# Patient Record
Sex: Male | Born: 1965 | Race: White | Hispanic: No | Marital: Married | Smoking: Former smoker
Health system: Southern US, Community
[De-identification: ages and names within clinical notes are randomized; demographics above are authoritative.]

## PROBLEM LIST (undated history)

## (undated) DIAGNOSIS — K4090 Unilateral inguinal hernia, without obstruction or gangrene, not specified as recurrent: Secondary | ICD-10-CM

## (undated) DIAGNOSIS — N529 Male erectile dysfunction, unspecified: Secondary | ICD-10-CM

## (undated) DIAGNOSIS — K089 Disorder of teeth and supporting structures, unspecified: Secondary | ICD-10-CM

## (undated) DIAGNOSIS — J309 Allergic rhinitis, unspecified: Secondary | ICD-10-CM

## (undated) DIAGNOSIS — R7301 Impaired fasting glucose: Secondary | ICD-10-CM

## (undated) HISTORY — DX: Impaired fasting glucose: R73.01

## (undated) HISTORY — DX: Disorder of teeth and supporting structures, unspecified: K08.9

## (undated) HISTORY — DX: Allergic rhinitis, unspecified: J30.9

## (undated) HISTORY — DX: Male erectile dysfunction, unspecified: N52.9

---

## 2004-11-21 ENCOUNTER — Emergency Department: Payer: Self-pay | Admitting: Internal Medicine

## 2007-05-19 ENCOUNTER — Emergency Department: Payer: Self-pay | Admitting: Emergency Medicine

## 2011-06-06 ENCOUNTER — Emergency Department: Payer: Self-pay | Admitting: Internal Medicine

## 2011-06-20 ENCOUNTER — Ambulatory Visit: Payer: Self-pay | Admitting: Orthopedic Surgery

## 2013-02-27 ENCOUNTER — Emergency Department: Payer: Self-pay | Admitting: Internal Medicine

## 2013-11-15 ENCOUNTER — Emergency Department: Payer: Self-pay | Admitting: Emergency Medicine

## 2015-05-13 ENCOUNTER — Emergency Department: Payer: BLUE CROSS/BLUE SHIELD

## 2015-05-13 ENCOUNTER — Encounter: Payer: Self-pay | Admitting: Emergency Medicine

## 2015-05-13 ENCOUNTER — Emergency Department
Admission: EM | Admit: 2015-05-13 | Discharge: 2015-05-13 | Disposition: A | Discharge status: Home / Self Care | Payer: BLUE CROSS/BLUE SHIELD | Attending: Emergency Medicine | Admitting: Emergency Medicine

## 2015-05-13 DIAGNOSIS — M549 Dorsalgia, unspecified: Secondary | ICD-10-CM | POA: Diagnosis not present

## 2015-05-13 DIAGNOSIS — R1030 Lower abdominal pain, unspecified: Secondary | ICD-10-CM | POA: Diagnosis not present

## 2015-05-13 DIAGNOSIS — R55 Syncope and collapse: Secondary | ICD-10-CM

## 2015-05-13 DIAGNOSIS — R197 Diarrhea, unspecified: Secondary | ICD-10-CM | POA: Insufficient documentation

## 2015-05-13 HISTORY — DX: Unilateral inguinal hernia, without obstruction or gangrene, not specified as recurrent: K40.90

## 2015-05-13 LAB — CBC
HEMATOCRIT: 37.8 % — AB (ref 40.0–52.0)
HEMOGLOBIN: 13.4 g/dL (ref 13.0–18.0)
MCH: 33.1 pg (ref 26.0–34.0)
MCHC: 35.4 g/dL (ref 32.0–36.0)
MCV: 93.4 fL (ref 80.0–100.0)
Platelets: 141 10*3/uL — ABNORMAL LOW (ref 150–440)
RBC: 4.05 MIL/uL — AB (ref 4.40–5.90)
RDW: 12.9 % (ref 11.5–14.5)
WBC: 7.2 10*3/uL (ref 3.8–10.6)

## 2015-05-13 LAB — COMPREHENSIVE METABOLIC PANEL
ALBUMIN: 4 g/dL (ref 3.5–5.0)
ALK PHOS: 49 U/L (ref 38–126)
ALT: 23 U/L (ref 17–63)
AST: 21 U/L (ref 15–41)
Anion gap: 6 (ref 5–15)
BUN: 19 mg/dL (ref 6–20)
CALCIUM: 8.9 mg/dL (ref 8.9–10.3)
CHLORIDE: 105 mmol/L (ref 101–111)
CO2: 28 mmol/L (ref 22–32)
CREATININE: 1.18 mg/dL (ref 0.61–1.24)
GFR calc non Af Amer: >60 mL/min (ref >60)
Glucose, Bld: 146 mg/dL — ABNORMAL HIGH (ref 65–99)
Potassium: 3.4 mmol/L — ABNORMAL LOW (ref 3.5–5.1)
SODIUM: 139 mmol/L (ref 135–145)
TOTAL PROTEIN: 6.6 g/dL (ref 6.5–8.1)
Total Bilirubin: 0.5 mg/dL (ref 0.3–1.2)

## 2015-05-13 LAB — URINALYSIS COMPLETE WITH MICROSCOPIC (ARMC ONLY)
BACTERIA UA: NONE SEEN
Bilirubin Urine: NEGATIVE
Glucose, UA: NEGATIVE mg/dL
HGB URINE DIPSTICK: NEGATIVE
KETONES UR: NEGATIVE mg/dL
Leukocytes, UA: NEGATIVE
NITRITE: NEGATIVE
PH: 5 (ref 5.0–8.0)
PROTEIN: NEGATIVE mg/dL
SPECIFIC GRAVITY, URINE: 1.025 (ref 1.005–1.030)
Squamous Epithelial / LPF: NONE SEEN

## 2015-05-13 LAB — LIPASE, BLOOD: LIPASE: 23 U/L (ref 11–51)

## 2015-05-13 LAB — TROPONIN I

## 2015-05-13 MED ORDER — DIATRIZOATE MEGLUMINE & SODIUM 66-10 % PO SOLN
15.0000 mL | Freq: Once | ORAL | Status: AC
  Administered 2015-05-13: 15 mL via ORAL

## 2015-05-13 MED ORDER — SENNA 8.6 MG PO TABS: 2.0000 | ORAL_TABLET | Freq: Two times a day (BID) | ORAL | Status: DC

## 2015-05-13 MED ORDER — DOCUSATE SODIUM 100 MG PO CAPS: 200.0000 mg | ORAL_CAPSULE | Freq: Two times a day (BID) | ORAL | Status: DC

## 2015-05-13 MED ORDER — IOPAMIDOL (ISOVUE-300) INJECTION 61%
100.0000 mL | Freq: Once | INTRAVENOUS | Status: AC | PRN
  Administered 2015-05-13: 100 mL via INTRAVENOUS

## 2015-05-13 NOTE — ED Provider Notes (Signed)
[facility] Emergency Department Provider Note  ____________________________________________  Time seen: Approximately 636 AM  I have reviewed the triage vital signs and the nursing notes.   HISTORY  Chief Complaint Abdominal Pain    HPI Tyler Conner Sr. is a 50 y.o. male who comes into the hospital today with abdominal pain. The patient's significant other reports that one year ago he had a hernia and was told to take it easy and that it would get better. The patient's significant other reports that he's had a cold since last week and has been doing a lot of lifting and straining. She is concerned that the hernia is coming back. The patient has had some pain across his lower abdomen for the last couple of days and he has had been having some difficulty having a bowel movement. This morning the patient went to attempt to use restroom and according to his wife he looked gray and like he was 1 past. He was sweaty and pale. He has not had any vomiting but the patient's wife became concerned. He reports that she was looking on the Internet and they became concerned about his previous hernia. He reports that the pain is improved at this point but he did have an episode of diarrhea this morning. He keeps saying that he feels as though he has to have a bowel movement but then he's had a hard time or has diarrhea. He's had no fevers and no sick contacts. He has not taken any medications for his pain. Again the pain started multiple days ago but the pain seemed to get worse today and he seemed to have worse symptoms with the presyncopal event at home. The patient's wife brought him into the hospital for evaluation.   Past Medical History  Diagnosis Date  . Inguinal hernia     There are no active problems to display for this patient.   History reviewed. No pertinent past surgical history.  No current outpatient prescriptions on file.  Allergies Review of patient's  allergies indicates no known allergies.  History reviewed. No pertinent family history.  Social History Social History  Substance Use Topics  . Smoking status: Never Smoker   . Smokeless tobacco: None  . Alcohol Use: No    Review of Systems Constitutional: No fever/chills Eyes: No visual changes. ENT: No sore throat. Cardiovascular: Denies chest pain. Respiratory: Denies shortness of breath. Gastrointestinal:  abdominal pain And diarrhea.  No constipation. Genitourinary: Negative for dysuria. Musculoskeletal: Right sided back pain. Skin: Negative for rash. Neurological: Negative for headaches, focal weakness or numbness.  10-point ROS otherwise negative.  ____________________________________________   PHYSICAL EXAM:  VITAL SIGNS: ED Triage Vitals  Enc Vitals Group     BP 05/13/15 0551 122/85 mmHg     Pulse Rate 05/13/15 0551 62     Resp 05/13/15 0551 18     Temp 05/13/15 0551 97.7 F (36.5 C)     Temp Source 05/13/15 0551 Oral     SpO2 05/13/15 0551 97 %     Weight 05/13/15 0551 150 lb (68.04 kg)     Height 05/13/15 0551  (1.854 m)     Head Cir --      Peak Flow --      Pain Score 05/13/15 0552 2     Pain Loc --      Pain Edu? --      Excl. in GC? --     Constitutional: Alert and oriented. Well  appearing and in mild distress. Eyes: Conjunctivae are normal. PERRL. EOMI. Head: Atraumatic. Nose: No congestion/rhinnorhea. Mouth/Throat: Mucous membranes are moist.  Oropharynx non-erythematous. Cardiovascular: Normal rate, regular rhythm. Grossly normal heart sounds.  Good peripheral circulation. Respiratory: Normal respiratory effort.  No retractions. Lungs CTAB. Gastrointestinal: Soft and nontender. No distention. Positive bowel sounds Musculoskeletal: No lower extremity tenderness nor edema.   Neurologic:  Normal speech and language.  Skin:  Skin is warm, dry and intact.  Psychiatric: Mood and affect are normal.    ____________________________________________   LABS (all labs ordered are listed, but only abnormal results are displayed)  Labs Reviewed  COMPREHENSIVE METABOLIC PANEL - Abnormal; Notable for the following:    Potassium 3.4 (*)    Glucose, Bld 146 (*)    All other components within normal limits  CBC - Abnormal; Notable for the following:    RBC 4.05 (*)    HCT 37.8 (*)    Platelets 141 (*)    All other components within normal limits  URINALYSIS COMPLETEWITH MICROSCOPIC ([facility] ONLY) - Abnormal; Notable for the following:    Color, Urine YELLOW (*)    APPearance CLEAR (*)    All other components within normal limits  LIPASE, BLOOD  TROPONIN I   ____________________________________________  EKG  None ____________________________________________  RADIOLOGY  CT abd and pelvis pending ____________________________________________   PROCEDURES  Procedure(s) performed: None  Critical Care performed: No  ____________________________________________   INITIAL IMPRESSION / ASSESSMENT AND PLAN / ED COURSE  Pertinent labs & imaging results that were available during my care of the patient were reviewed by me and considered in my medical decision making (see chart for details).  This is a 50 year old male who comes into the hospital today with some abdominal pain and back pain. The patient also had some diarrhea but did turn gray and diaphoretic with a presyncopal event tonight. I will give the patient liter of normal saline but we will send him for a CT scan. The patient's blood work at this time is unremarkable but we will send him for a CT scan looking to evaluate his hernia or any other possible cause of his symptoms. The patient's care was signed out to Dr. Scotty CourtStafford who will follow-up the results of the patient's CT scan. ____________________________________________   FINAL CLINICAL IMPRESSION(S) / ED DIAGNOSES  Final diagnoses:  Lower abdominal pain       Rebecka ApleyAllison P Webster, MD 05/13/15 [phone]

## 2015-05-13 NOTE — ED Notes (Signed)
Pt c/o intermittent abd pain across the center of his abd for a couple of days; last normal bowel movement was a few days ago; denies N/V; denies urinary s/s;

## 2015-05-13 NOTE — Discharge Instructions (Signed)
Abdominal Pain, Adult °Many things can cause abdominal pain. Usually, abdominal pain is not caused by a disease and will improve without treatment. It can often be observed and treated at home. Your health care provider will do a physical exam and possibly order blood tests and X-rays to help determine the seriousness of your pain. However, in many cases, more time must pass before a clear cause of the pain can be found. Before that point, your health care provider may not know if you need more testing or further treatment. °HOME CARE INSTRUCTIONS °Monitor your abdominal pain for any changes. The following actions may help to alleviate any discomfort you are experiencing: °· Only take over-the-counter or prescription medicines as directed by your health care provider. °· Do not take laxatives unless directed to do so by your health care provider. °· Try a clear liquid diet (broth, tea, or water) as directed by your health care provider. Slowly move to a bland diet as tolerated. °SEEK MEDICAL CARE IF: °· You have unexplained abdominal pain. °· You have abdominal pain associated with nausea or diarrhea. °· You have pain when you urinate or have a bowel movement. °· You experience abdominal pain that wakes you in the night. °· You have abdominal pain that is worsened or improved by eating food. °· You have abdominal pain that is worsened with eating fatty foods. °· You have a fever. °SEEK IMMEDIATE MEDICAL CARE IF: °· Your pain does not go away within 2 hours. °· You keep throwing up (vomiting). °· Your pain is felt only in portions of the abdomen, such as the right side or the left lower portion of the abdomen. °· You pass bloody or black tarry stools. °MAKE SURE YOU: °· Understand these instructions. °· Will watch your condition. °· Will get help right away if you are not doing well or get worse. °  °This information is not intended to replace advice given to you by your health care provider. Make sure you discuss  any questions you have with your health care provider. °  °Document Released: 11/12/2004 Document Revised: 10/24/2014 Document Reviewed: 10/12/2012 °Elsevier Interactive Patient Education ©2016 [employer] ° °Near-Syncope °Near-syncope (commonly known as near fainting) is sudden weakness, dizziness, or feeling like you might pass out. During an episode of near-syncope, you may also develop pale skin, have tunnel vision, or feel sick to your stomach (nauseous). Near-syncope may occur when getting up after sitting or while standing for a long time. It is caused by a sudden decrease in blood flow to the brain. This decrease can result from various causes or triggers, most of which are not serious. However, because near-syncope can sometimes be a sign of something serious, a medical evaluation is required. The specific cause is often not determined. °HOME CARE INSTRUCTIONS  °Monitor your condition for any changes. The following actions may help to alleviate any discomfort you are experiencing: °· Have someone stay with you until you feel stable. °· Lie down right away and prop your feet up if you start feeling like you might faint. Breathe deeply and steadily. Wait until all the symptoms have passed. Most of these episodes last only a few minutes. You may feel tired for several hours.   °· Drink enough fluids to keep your urine clear or pale yellow.   °· If you are taking blood pressure or heart medicine, get up slowly when seated or lying down. Take several minutes to sit and then stand. This can reduce dizziness. °· Follow   up with your health care provider as directed.  °SEEK IMMEDIATE MEDICAL CARE IF:  °· You have a severe headache.   °· You have unusual pain in the chest, abdomen, or back.   °· You are bleeding from the mouth or rectum, or you have black or tarry stool.   °· You have an irregular or very fast heartbeat.   °· You have repeated fainting or have seizure-like jerking during an episode.   °· You faint  when sitting or lying down.   °· You have confusion.   °· You have difficulty walking.   °· You have severe weakness.   °· You have vision problems.   °MAKE SURE YOU:  °· Understand these instructions. °· Will watch your condition. °· Will get help right away if you are not doing well or get worse. °  °This information is not intended to replace advice given to you by your health care provider. Make sure you discuss any questions you have with your health care provider. °  °Document Released: 02/02/2005 Document Revised: 02/07/2013 Document Reviewed: 07/08/2012 °Elsevier Interactive Patient Education ©2016 [employer] Inc. ° °

## 2015-05-13 NOTE — ED Provider Notes (Signed)
Patient calm and comfortable, vital signs normal. CT negative. His charge home follow up with primary care. Senna and Colace to avoid further straining that likely caused a vagal episode today from inadvertent Valsalva maneuver from increased intra-abdominal pressure from straining. No evidence of hernias.   Sharman CheekPhillip Verenice Westrich, MD 05/13/15 1007

## 2015-05-13 NOTE — ED Notes (Signed)
Blood draw attempt from left AC unsuccessful; pt tolerated well; prefaced procedure by saying he doesn't do well if he sees a needle; pt laying down in recliner in triage room

## 2015-05-14 ENCOUNTER — Emergency Department (HOSPITAL_COMMUNITY)
Admission: EM | Admit: 2015-05-14 | Discharge: 2015-05-14 | Disposition: A | Discharge status: Home / Self Care | Payer: BLUE CROSS/BLUE SHIELD | Attending: Emergency Medicine | Admitting: Emergency Medicine

## 2015-05-14 ENCOUNTER — Encounter (HOSPITAL_COMMUNITY): Payer: Self-pay | Admitting: Emergency Medicine

## 2015-05-14 DIAGNOSIS — Z79899 Other long term (current) drug therapy: Secondary | ICD-10-CM | POA: Diagnosis not present

## 2015-05-14 DIAGNOSIS — R1084 Generalized abdominal pain: Secondary | ICD-10-CM | POA: Diagnosis present

## 2015-05-14 DIAGNOSIS — R197 Diarrhea, unspecified: Secondary | ICD-10-CM | POA: Insufficient documentation

## 2015-05-14 DIAGNOSIS — R109 Unspecified abdominal pain: Secondary | ICD-10-CM

## 2015-05-14 LAB — COMPREHENSIVE METABOLIC PANEL
ALBUMIN: 4.2 g/dL (ref 3.5–5.0)
ALK PHOS: 53 U/L (ref 38–126)
ALT: 24 U/L (ref 17–63)
ANION GAP: 10 (ref 5–15)
AST: 25 U/L (ref 15–41)
BILIRUBIN TOTAL: 0.7 mg/dL (ref 0.3–1.2)
BUN: 17 mg/dL (ref 6–20)
CALCIUM: 9.1 mg/dL (ref 8.9–10.3)
CO2: 28 mmol/L (ref 22–32)
Chloride: 103 mmol/L (ref 101–111)
Creatinine, Ser: 1.15 mg/dL (ref 0.61–1.24)
GFR calc Af Amer: >60 mL/min (ref >60)
GFR calc non Af Amer: >60 mL/min (ref >60)
GLUCOSE: 185 mg/dL — AB (ref 65–99)
Potassium: 4 mmol/L (ref 3.5–5.1)
Sodium: 141 mmol/L (ref 135–145)
TOTAL PROTEIN: 7.2 g/dL (ref 6.5–8.1)

## 2015-05-14 LAB — URINE MICROSCOPIC-ADD ON
Bacteria, UA: NONE SEEN
SQUAMOUS EPITHELIAL / LPF: NONE SEEN
WBC UA: NONE SEEN WBC/hpf (ref 0–5)

## 2015-05-14 LAB — CBC
HCT: 41.3 % (ref 39.0–52.0)
HEMOGLOBIN: 14.3 g/dL (ref 13.0–17.0)
MCH: 32.3 pg (ref 26.0–34.0)
MCHC: 34.6 g/dL (ref 30.0–36.0)
MCV: 93.2 fL (ref 78.0–100.0)
Platelets: 162 10*3/uL (ref 150–400)
RBC: 4.43 MIL/uL (ref 4.22–5.81)
RDW: 12.3 % (ref 11.5–15.5)
WBC: 4.3 10*3/uL (ref 4.0–10.5)

## 2015-05-14 LAB — URINALYSIS, ROUTINE W REFLEX MICROSCOPIC
BILIRUBIN URINE: NEGATIVE
Glucose, UA: NEGATIVE mg/dL
KETONES UR: NEGATIVE mg/dL
Leukocytes, UA: NEGATIVE
NITRITE: NEGATIVE
PROTEIN: NEGATIVE mg/dL
Specific Gravity, Urine: 1.02 (ref 1.005–1.030)
pH: 5 (ref 5.0–8.0)

## 2015-05-14 LAB — LIPASE, BLOOD: Lipase: 25 U/L (ref 11–51)

## 2015-05-14 MED ORDER — DICYCLOMINE HCL 10 MG PO CAPS: 10.0000 mg | ORAL_CAPSULE | Freq: Two times a day (BID) | ORAL | Status: DC

## 2015-05-14 NOTE — ED Notes (Signed)
[facility] yesterday and treated with stool softner and laxative.  CT of abdomen done yesterday.

## 2015-05-14 NOTE — Discharge Instructions (Signed)

## 2015-05-14 NOTE — ED Provider Notes (Signed)
CSN: [id number]     Arrival date & time 05/14/15  5638 History  By signing my name below, I, Tyler Conner, attest that this documentation has been prepared under the direction and in the presence of [name], MD. Electronically Signed: Ronney Conner, ED Scribe. 05/14/2015. 11:43 AM.    Chief Complaint  Patient presents with  . Abdominal Cramping   Patient is a 50 y.o. male presenting with cramps. The history is provided by the patient. No language interpreter was used.  Abdominal Cramping This is a new problem. The current episode started yesterday. The problem occurs constantly. The problem has been gradually worsening. Associated symptoms include abdominal pain. Pertinent negatives include no chest pain and no shortness of breath. Nothing aggravates the symptoms. Nothing relieves the symptoms. Treatments tried: laxative, magnesium. The treatment provided no relief.   HPI Comments: Tyler Conner. is a 50 y.o. male who presents to the Emergency Department complaining of severe, cramping, generalized abdominal pain that began 3 days ago upon waking up. He complains of an associated sense of fecal urgency (although he does not always have an accompanying BM) and diarrhea that began yesterday. His wife states at one point over the past few days, patient appeared pale and near-syncopal, although that episode has since resolved. Patient states he was seen at [facility] ED yesterday and had blood lab tests done that were unremarkable. Patient states he had taken a laxative and magnesium with no relief. Patient had a CT scan done yesterday which shows that is he is status post left inguinal hernia repair, although patient himself denies a history of any hernia repairs. He denies a history of chronic medical conditions, abdominal surgeries, recent international travel, or sick contact. He denies vomiting, fever, dysuria, chest pain, SOB, or hematochezia.   Past Medical History  Diagnosis Date  .  Inguinal hernia    History reviewed. No pertinent past surgical history. History reviewed. No pertinent family history. Social History  Substance Use Topics  . Smoking status: Never Smoker   . Smokeless tobacco: None  . Alcohol Use: No    Review of Systems  Constitutional: Negative for fever.  Respiratory: Negative for shortness of breath.   Cardiovascular: Negative for chest pain.  Gastrointestinal: Positive for abdominal pain and diarrhea. Negative for vomiting and blood in stool.  Genitourinary: Negative for dysuria.  Skin: Positive for pallor (Positive for resolved episode of pallor, near-syncopal.).  All other systems reviewed and are negative.   Allergies  Review of patient's allergies indicates no known allergies.  Home Medications   Prior to Admission medications   Medication Sig Start Date End Date Taking? Authorizing Provider  docusate sodium (COLACE) 100 MG capsule Take 2 capsules (200 mg total) by mouth 2 (two) times daily. 05/13/15  Yes Sharman Cheek, MD  senna (SENOKOT) 8.6 MG TABS tablet Take 2 tablets (17.2 mg total) by mouth 2 (two) times daily. 05/13/15  Yes Sharman Cheek, MD   BP 118/79 mmHg  Pulse 78  Temp(Src) 98.3 F (36.8 C) (Oral)  Resp 18  Ht  (1.854 m)  Wt 160 lb (72.576 kg)  BMI 21.11 kg/m2  SpO2 98% Physical Exam  Nursing note and vitals reviewed. CONSTITUTIONAL: Well developed/well nourished HEAD: Normocephalic/atraumatic EYES: EOMI/PERRL ENMT: Mucous membranes moist NECK: supple no meningeal signs SPINE/BACK:entire spine nontender CV: S1/S2 noted, no murmurs/rubs/gallops noted LUNGS: Lungs are clear to auscultation bilaterally, no apparent distress ABDOMEN: soft, nontender, no rebound or guarding, bowel sounds noted throughout abdomen GU:no cva  tenderness, no inguinal hernia noted, no scar NEURO: Pt is awake/alert/appropriate, moves all extremitiesx4.  No facial droop.   EXTREMITIES: pulses normal/equal, full ROM SKIN: warm,  color normal PSYCH: no abnormalities of mood noted, alert and oriented to situation   ED Course  Procedures   DIAGNOSTIC STUDIES: Oxygen Saturation is 98% on RA, normal by my interpretation.    COORDINATION OF CARE: 11:40 AM - Lab results and CT results reviewed with patient. Discussed treatment plan with pt at bedside which includes Bentyl for 1 week. Advised pt to follow-up with GI specialist if his symptoms do not improve with Bentyl. Do not believe imaging is warranted at this time. Recommended bland diet. Pt verbalized understanding and agreed to plan.   i spoke to dr mark boles concerning recent CT imaging No evidence of inguinal hernia repair on CT imaging on second look Referred to GI Will start bentyl No focal tenderness on today's exam   Labs Review Labs Reviewed  COMPREHENSIVE METABOLIC PANEL - Abnormal; Notable for the following:    Glucose, Bld 185 (*)    All other components within normal limits  URINALYSIS, ROUTINE W REFLEX MICROSCOPIC (NOT AT [facility]) - Abnormal; Notable for the following:    Hgb urine dipstick TRACE (*)    All other components within normal limits  LIPASE, BLOOD  CBC  URINE MICROSCOPIC-ADD ON    Imaging Review Ct Abdomen Pelvis W Contrast  05/13/2015  CLINICAL DATA:  Intermittent abdominal pain for 3 days. Constipation. EXAM: CT ABDOMEN AND PELVIS WITH CONTRAST TECHNIQUE: Multidetector CT imaging of the abdomen and pelvis was performed using the standard protocol following bolus administration of intravenous contrast. Oral contrast was also administered. CONTRAST:  ISOVUE-300 IOPAMIDOL (ISOVUE-300) INJECTION 61% COMPARISON:  None. FINDINGS: Lower chest: There is mild atelectasis in the posterior left base region. Lung bases otherwise are clear. Hepatobiliary: No focal liver lesions are identified. Gallbladder wall is not appreciably thickened. There is no biliary duct dilatation. Pancreas: There is no demonstrable pancreatic mass or inflammatory  focus. Spleen: No splenic lesions are evident. There is a small splenule medial to the spleen. Adrenals/Urinary Tract: Adrenals appear normal bilaterally. Kidneys appear normal bilaterally. There is no renal or ureteral calculus on either side. Urinary bladder is midline with wall thickness within normal limits. Stomach/Bowel: There are multiple sigmoid diverticula without demonstrable diverticulitis. There is no bowel wall or mesenteric thickening. No bowel obstruction. No free air or portal venous air. Vascular/Lymphatic: There is mild atherosclerotic change in the aorta. There is no demonstrable abdominal aortic aneurysm. The major mesenteric vessels appear patent. There is no adenopathy in the abdomen or pelvis. Reproductive: Prostate and seminal vesicles appear normal in size and contour. There are small scrotal hydroceles bilaterally. There is no pelvic mass or pelvic fluid collection. Other: Patient has had a left inguinal hernia repair. No hernia currently evident. Appendix appears normal. There is no ascites or abscess in the abdomen or pelvis. Musculoskeletal: There are no blastic or lytic bone lesions. There is degenerative change in the lower lumbar spine. There is no intramuscular or abdominal wall lesion. IMPRESSION: Multiple sigmoid diverticula without diverticulitis. No bowel obstruction. No abscess. Appendix region appears unremarkable. Small scrotal hydroceles bilaterally. Status post left inguinal hernia repair. No hernia currently evident. No renal or ureteral calculi.  No hydronephrosis. Electronically Signed   By: Bretta Bang III M.D.   On: 05/13/2015 09:50   I have personally reviewed and evaluated these lab results as part of my medical decision-making.  MDM   Final diagnoses:  Abdominal cramping    Nursing notes including past medical history and social history reviewed and considered in documentation Labs/vital reviewed myself and considered during evaluation .dwpre   I  personally performed the services described in this documentation, which was scribed in my presence. The recorded information has been reviewed and is accurate.        Zadie Rhineonald Meg Niemeier, MD 05/14/15 1250

## 2015-07-03 ENCOUNTER — Encounter: Payer: BLUE CROSS/BLUE SHIELD | Attending: Family Medicine | Admitting: Dietician

## 2015-07-03 ENCOUNTER — Encounter: Payer: Self-pay | Admitting: Dietician

## 2015-07-03 VITALS — Ht 73.0 in | Wt 156.0 lb

## 2015-07-03 DIAGNOSIS — R7301 Impaired fasting glucose: Secondary | ICD-10-CM

## 2015-07-03 NOTE — Patient Instructions (Signed)
Increase water intake.  Decrease sweet tea. Establish consistent meal plan of 3 meals and 3 snacks. 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 Balance meals with protein, 3-6 servings of carbohydrates and non-starchy vegetables. Include healthy fats such as fat in peanut butter, nuts, oils and margarines that don't contain trans fat (Promise, "I Can't Believe it's not butter" or Smart Balance".

## 2015-07-03 NOTE — Progress Notes (Signed)
Medical Nutrition Therapy: Visit start time: 8:15  end time:9:30am Assessment:  Diagnosis: impaired glucose tolerance Past medical history: inguinal hernia Psychosocial issues/ stress concerns: none identified Preferred learning method:  . Hands-on Current weight: 156 lbs  Height: 73 in Medications, supplements: see list Progress and evaluation:  Patient accompanied by his wife in for initial medical nutrition therapy visit. He reports that he has tried recently to make some diet changes. 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 Most of his calories during the day were contributed by sweet tea and he estimates he drinks as much as 1/2 to 1 gallon daily. Presently, he has been trying to eat breakfast and lunch. Most of his dinner meals are at home and his wife has been baking more meats verses frying. He has decreased evening snacking. He states "Before, I was eating a sleeve of oreos with milk and would feel lousy in the mornings". He also reports symptoms of low blood sugar when he hasn't eaten for hours. He doesn't check his blood glucose at home. His mother has diabetes and has a glucose meter. He reports that his weight typically stays in 155-160 range. His diet is low in fruits/vegetables, fiber and whole grain sources. Physical activity: States that his job involves a lot of walking  Dietary Intake:  Usual eating pattern includes 2-3 meals and 1-2 snacks per day. Dining out frequency: 4 meals per week.  Breakfast: 6-8:00am- banana, lite English muffin with sausage/cheese, sweet tea Lunch: Big Mac, tea or double cheeseburger, tea or 12" sub sandwich, chips, tea  Supper: 6:00pm- baked chicken or pork, potatoes or rice, corn or green beans or broccoli or a pasta meal Snack: lately has not been eating after dinner Beverages: Sweet tea (1/2 to 1 gallon/day); rarely drinks water; no sodas  Nutrition Care Education: Impaired glucose  tolerance: Commended patient on diet changes he has made thus far.Instructed on general dietary guidelines for pre-diabetes. Discussed the importance of establishing a consistent meal pattern rather than taking in a majority of his food from 6:00 to bedtime.Also instructed on carbohydrate counting including the need to spread food over 3 meals and 3 snacks. Discussed how drinking large amounts of sweet tea is over working his pancreas. Discussed how snacks will be necessary to provide adequate calories especially when decreasing calories from tea. Gave and reviewed menu examples. 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  Nutritional Diagnosis:  NI-5.8.2 Excessive carbohydrate intake As related to excessive sweet tea.  As evidenced by diet history..  Intervention:  Increase water intake.  Decrease sweet tea. Establish consistent meal plan of 3 meals and 3 snacks. 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 Balance meals with protein, 3-6 servings of carbohydrates and non-starchy vegetables. Include healthy fats such as fat in peanut butter, nuts, oils and margarines that don't contain trans fat (Promise, "I Can't Believe it's not butter" or Smart Balance".  Education Materials given:  . Food lists/ Planning A Balanced Meal . Sample meal pattern/ menus . Snacking handout . Goals/ instructions  Learner/ who was taught:  . Patient  . Spouse/ partner  Level of understanding: . Partial understanding; needs review/ practice Learning barriers: . None Willingness to learn/ readiness for change: . Change in Progress.Patient stated, "I have to change. I want to live".  Monitoring and Evaluation:  No follow-up scheduled. Encouraged patient to call if has further questions or if desires  additional help with his diet/nutrition.

## 2017-04-02 IMAGING — CT CT ABD-PELV W/ CM
1 of 3 series · 13 of 32 positions shown, 18 images · IV contrast (iopamidol)
Comparison: None.

CLINICAL DATA: Intermittent abdominal pain for 3 days.
Constipation.

EXAM:
CT ABDOMEN AND PELVIS WITH CONTRAST
TECHNIQUE: Multidetector CT imaging of the abdomen and pelvis was performed
using the standard protocol following bolus administration of
intravenous contrast. Oral contrast was also administered.
CONTRAST:  100mL [id number] IOPAMIDOL ([id number]) INJECTION 61%

[Series 2: routine abd pel with · axial · 0.72mm/px · z∈[-1082,-657]mm · 13 of 95 slices shown, 18 images]
[im 5/95  soft-tissue]
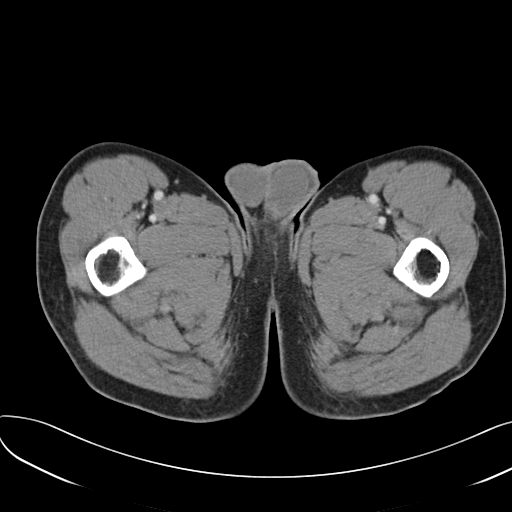
[im 5/95  bone]
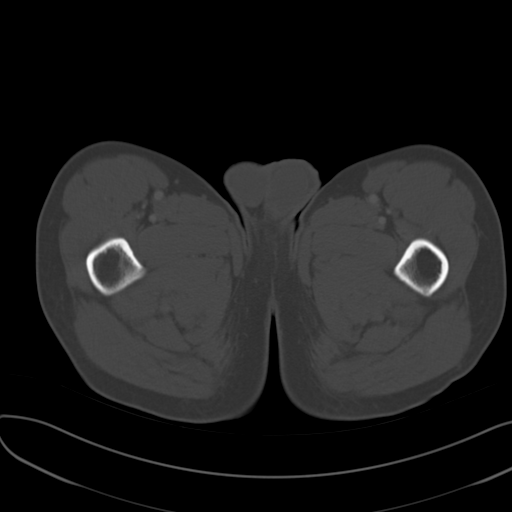
[im 15/95  soft-tissue]
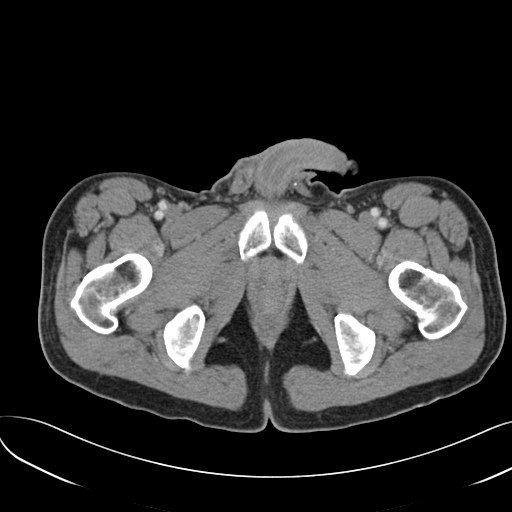
[im 19/95  soft-tissue]
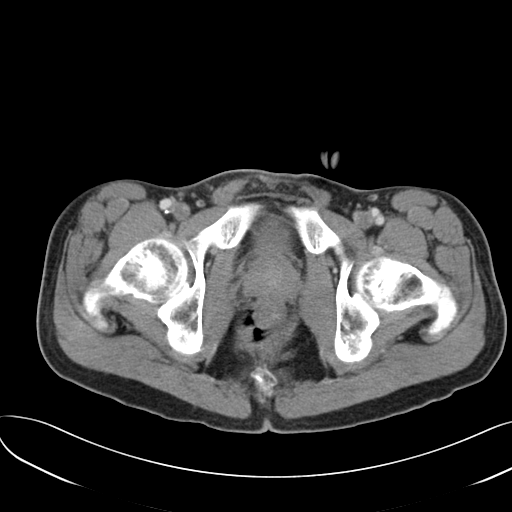
[im 29/95  soft-tissue]
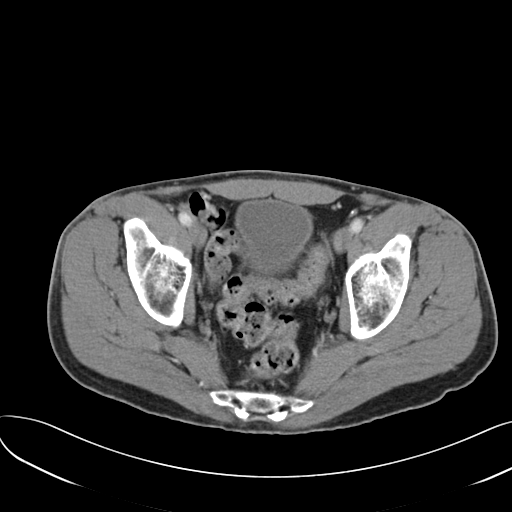
[im 38/95  soft-tissue]
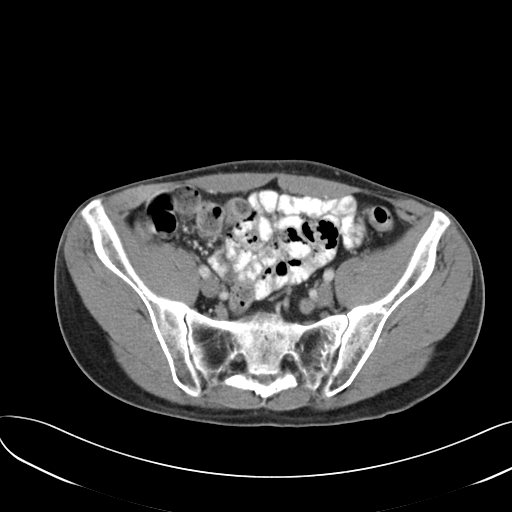
[im 43/95  soft-tissue]
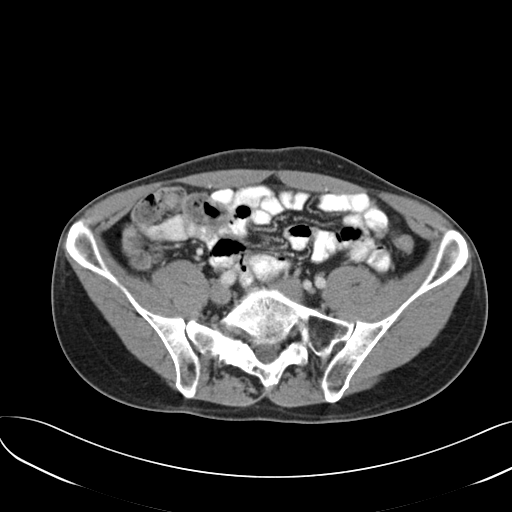
[im 52/95  soft-tissue]
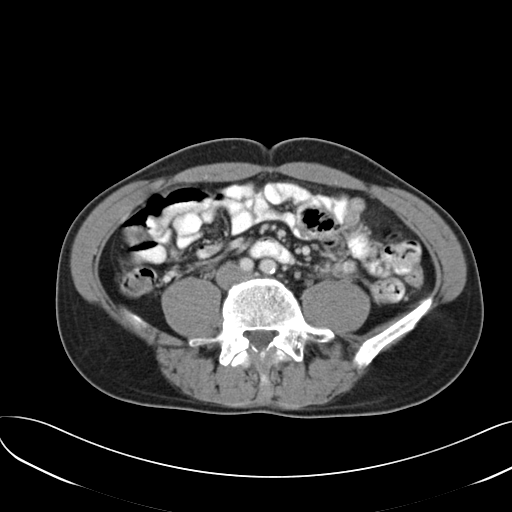
[im 57/95  soft-tissue]
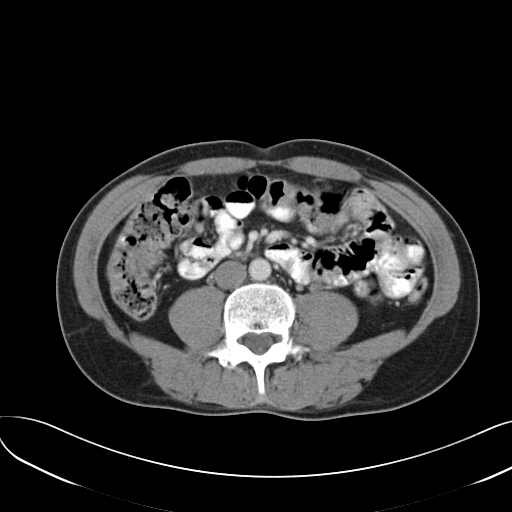
[im 66/95  soft-tissue]
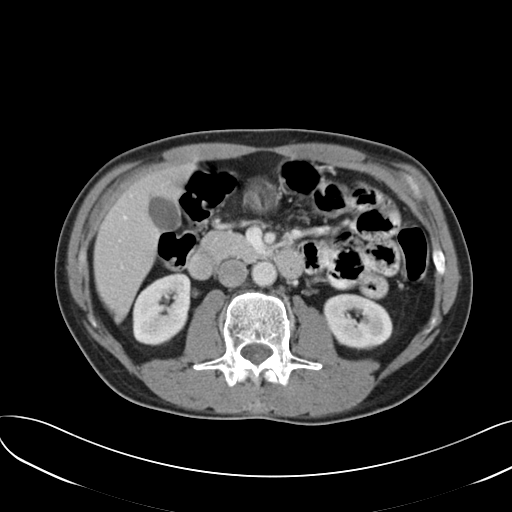
[im 66/95  bone]
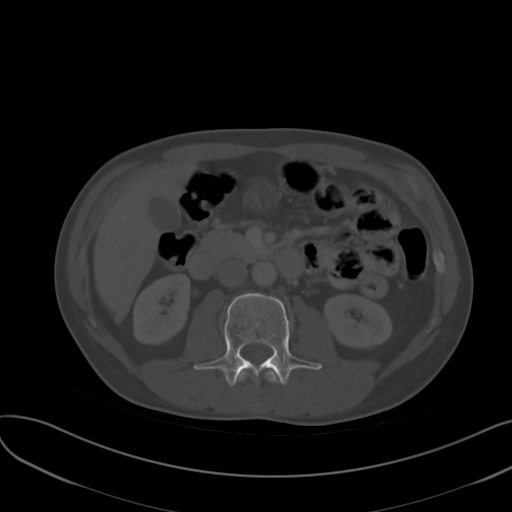
[im 76/95  soft-tissue]
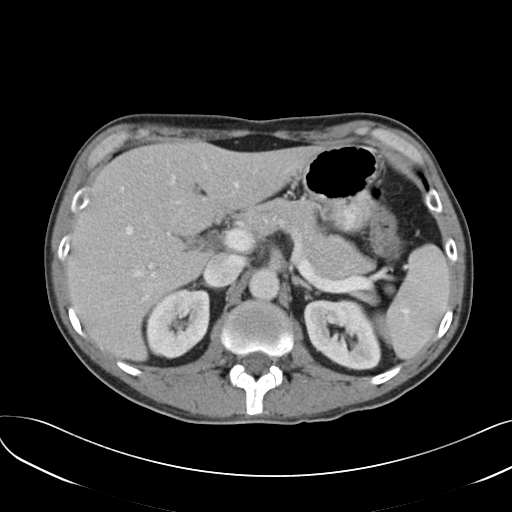
[im 76/95  lung]
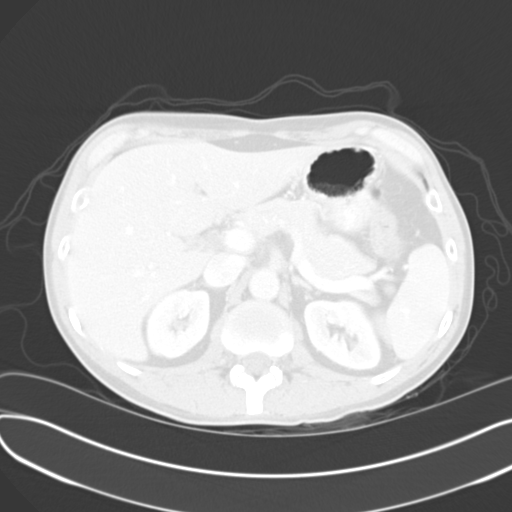
[im 80/95  soft-tissue]
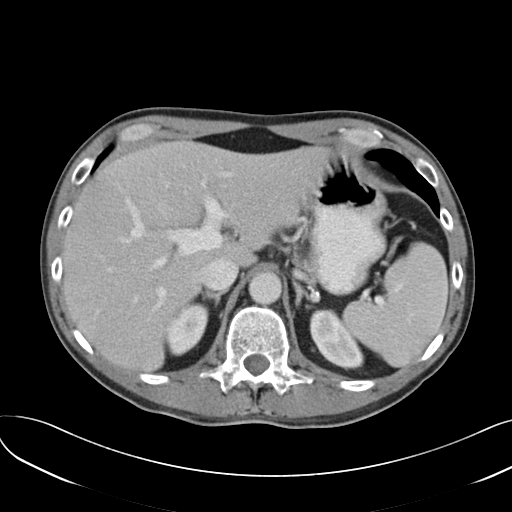
[im 80/95  lung]
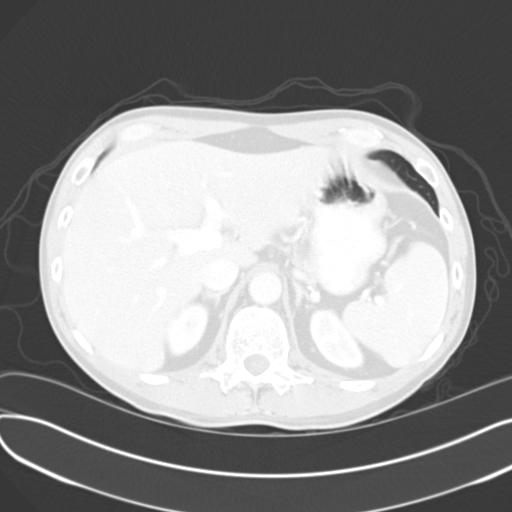
[im 85/95  lung]
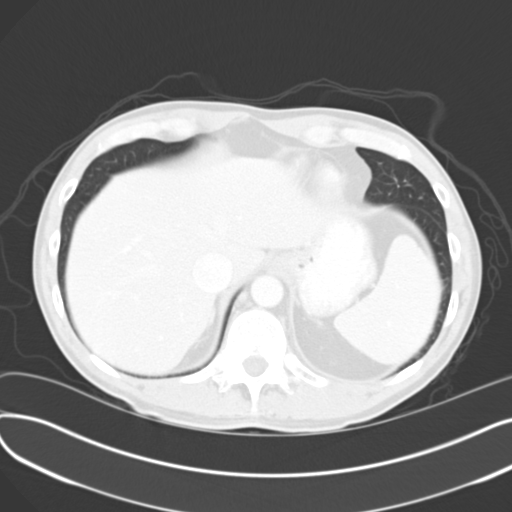
[im 90/95  soft-tissue]
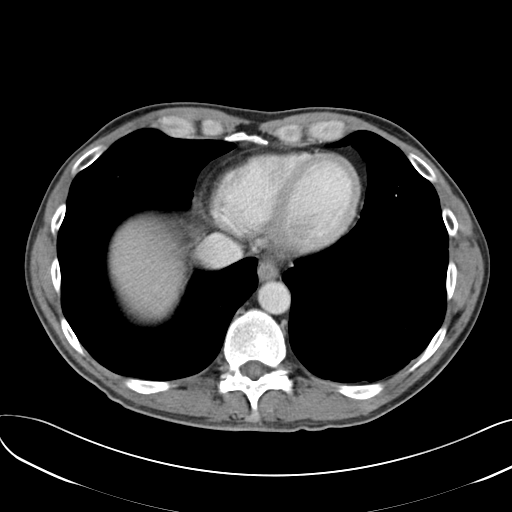
[im 90/95  lung]
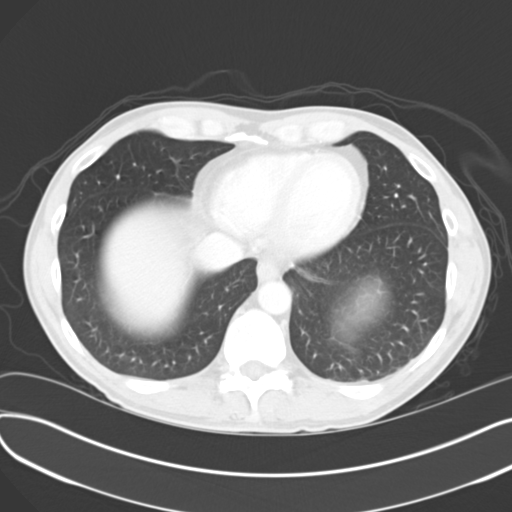

[13 of 32 positions shown; findings below may reference images not displayed]

FINDINGS: Lower chest: There is mild atelectasis in the posterior left base
region. Lung bases otherwise are clear.

Hepatobiliary: No focal liver lesions are identified. Gallbladder
wall is not appreciably thickened. There is no biliary duct
dilatation.

Pancreas: There is no demonstrable pancreatic mass or inflammatory
focus.

Spleen: No splenic lesions are evident. There is a small splenule
medial to the spleen.

Adrenals/Urinary Tract: Adrenals appear normal bilaterally. Kidneys
appear normal bilaterally. There is no renal or ureteral calculus on
either side. Urinary bladder is midline with wall thickness within
normal limits.

Stomach/Bowel: There are multiple sigmoid diverticula without
demonstrable diverticulitis. There is no bowel wall or mesenteric
thickening. No bowel obstruction. No free air or portal venous air.

Vascular/Lymphatic: There is mild atherosclerotic change in the
aorta. There is no demonstrable abdominal aortic aneurysm. The major
mesenteric vessels appear patent. There is no adenopathy in the
abdomen or pelvis.

Reproductive: Prostate and seminal vesicles appear normal in size
and contour. There are small scrotal hydroceles bilaterally. There
is no pelvic mass or pelvic fluid collection.

Other: Patient has had a left inguinal hernia repair. No hernia
currently evident. Appendix appears normal. There is no ascites or
abscess in the abdomen or pelvis.

Musculoskeletal: There are no blastic or lytic bone lesions. There
is degenerative change in the lower lumbar spine. There is no
intramuscular or abdominal wall lesion.
IMPRESSION: Multiple sigmoid diverticula without diverticulitis.

No bowel obstruction. No abscess. Appendix region appears
unremarkable.

Small scrotal hydroceles bilaterally. Status post left inguinal
hernia repair. No hernia currently evident.

No renal or ureteral calculi.  No hydronephrosis.

## 2018-03-15 ENCOUNTER — Emergency Department
Admission: EM | Admit: 2018-03-15 | Discharge: 2018-03-15 | Disposition: A | Discharge status: Home / Self Care | Payer: BLUE CROSS/BLUE SHIELD | Attending: Emergency Medicine | Admitting: Emergency Medicine

## 2018-03-15 ENCOUNTER — Encounter: Payer: Self-pay | Admitting: Medical Oncology

## 2018-03-15 DIAGNOSIS — Z87891 Personal history of nicotine dependence: Secondary | ICD-10-CM | POA: Diagnosis not present

## 2018-03-15 DIAGNOSIS — Y939 Activity, unspecified: Secondary | ICD-10-CM | POA: Diagnosis not present

## 2018-03-15 DIAGNOSIS — X04XXXA Exposure to ignition of highly flammable material, initial encounter: Secondary | ICD-10-CM | POA: Insufficient documentation

## 2018-03-15 DIAGNOSIS — Y999 Unspecified external cause status: Secondary | ICD-10-CM | POA: Diagnosis not present

## 2018-03-15 DIAGNOSIS — T22011A Burn of unspecified degree of right forearm, initial encounter: Secondary | ICD-10-CM | POA: Diagnosis present

## 2018-03-15 DIAGNOSIS — T22219A Burn of second degree of unspecified forearm, initial encounter: Secondary | ICD-10-CM | POA: Diagnosis not present

## 2018-03-15 DIAGNOSIS — Y929 Unspecified place or not applicable: Secondary | ICD-10-CM | POA: Insufficient documentation

## 2018-03-15 DIAGNOSIS — T22211A Burn of second degree of right forearm, initial encounter: Secondary | ICD-10-CM
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
## 2018-11-23 ENCOUNTER — Ambulatory Visit: Payer: 59

## 2018-11-23 ENCOUNTER — Other Ambulatory Visit: Payer: Self-pay

## 2018-11-23 DIAGNOSIS — Z01818 Encounter for other preprocedural examination: Secondary | ICD-10-CM

## 2018-11-23 DIAGNOSIS — Z0000 Encounter for general adult medical examination without abnormal findings: Secondary | ICD-10-CM

## 2018-11-23 LAB — POCT URINALYSIS DIPSTICK
Bilirubin, UA: NEGATIVE
Blood, UA: NEGATIVE
Glucose, UA: NEGATIVE
Ketones, UA: NEGATIVE
Leukocytes, UA: NEGATIVE
Nitrite, UA: NEGATIVE
Protein, UA: NEGATIVE
Spec Grav, UA: >=1.03 — AB (ref 1.010–1.025)
Urobilinogen, UA: 0.2 E.U./dL
pH, UA: 6 (ref 5.0–8.0)

## 2018-11-23 NOTE — Progress Notes (Signed)
Physical scheduled with Randel Pigg, PA-C on 12/05/2018.  AMD

## 2018-11-24 LAB — CMP12+LP+TP+TSH+6AC+PSA+CBC?
Albumin: 4.1 g/dL (ref 3.8–4.9)
Alkaline Phosphatase: 64 IU/L (ref 39–117)
Basophils Absolute: 0 10*3/uL (ref 0.0–0.2)
Basos: 0 %
Chloride: 104 mmol/L (ref 96–106)
Cholesterol, Total: 160 mg/dL (ref 100–199)
Creatinine, Ser: 1.06 mg/dL (ref 0.76–1.27)
EOS (ABSOLUTE): 0.1 10*3/uL (ref 0.0–0.4)
Free Thyroxine Index: 1.7 (ref 1.2–4.9)
GGT: 13 IU/L (ref 0–65)
Glucose: 125 mg/dL — ABNORMAL HIGH (ref 65–99)
HDL: 48 mg/dL (ref >39)
Hematocrit: 38.2 % (ref 37.5–51.0)
Immature Grans (Abs): 0 10*3/uL (ref 0.0–0.1)
Immature Granulocytes: 0 %
LDH: 93 IU/L — ABNORMAL LOW (ref 121–224)
LDL Chol Calc (NIH): 102 mg/dL — ABNORMAL HIGH (ref 0–99)
Lymphocytes Absolute: 1.7 10*3/uL (ref 0.7–3.1)
MCH: 32.4 pg (ref 26.6–33.0)
MCHC: 34.8 g/dL (ref 31.5–35.7)
Monocytes Absolute: 0.6 10*3/uL (ref 0.1–0.9)
Neutrophils Absolute: 2.3 10*3/uL (ref 1.4–7.0)
Neutrophils: 49 %
Platelets: 230 10*3/uL (ref 150–450)
Potassium: 4.6 mmol/L (ref 3.5–5.2)
RBC: 4.1 x10E6/uL — ABNORMAL LOW (ref 4.14–5.80)
RDW: 12.2 % (ref 11.6–15.4)
Sodium: 141 mmol/L (ref 134–144)
T3 Uptake Ratio: 25 % (ref 24–39)
Uric Acid: 4.3 mg/dL (ref 3.7–8.6)
VLDL Cholesterol Cal: 10 mg/dL (ref 5–40)
WBC: 4.7 10*3/uL (ref 3.4–10.8)

## 2018-11-24 LAB — CMP12+LP+TP+TSH+6AC+PSA+CBC…
ALT: 15 IU/L (ref 0–44)
AST: 17 IU/L (ref 0–40)
Albumin/Globulin Ratio: 2 (ref 1.2–2.2)
BUN/Creatinine Ratio: 19 (ref 9–20)
BUN: 20 mg/dL (ref 6–24)
Bilirubin Total: 0.5 mg/dL (ref 0.0–1.2)
Calcium: 9.1 mg/dL (ref 8.7–10.2)
Chol/HDL Ratio: 3.3 ratio (ref 0.0–5.0)
Eos: 2 %
Estimated CHD Risk: <0.5 times avg. (ref 0.0–1.0)
GFR calc Af Amer: 93 mL/min/{1.73_m2} (ref >59)
GFR calc non Af Amer: 81 mL/min/{1.73_m2} (ref >59)
Globulin, Total: 2.1 g/dL (ref 1.5–4.5)
Hemoglobin: 13.3 g/dL (ref 13.0–17.7)
Iron: 99 ug/dL (ref 38–169)
Lymphs: 36 %
MCV: 93 fL (ref 79–97)
Monocytes: 13 %
Phosphorus: 3.3 mg/dL (ref 2.8–4.1)
Prostate Specific Ag, Serum: 0.5 ng/mL (ref 0.0–4.0)
T4, Total: 6.9 ug/dL (ref 4.5–12.0)
TSH: 0.564 u[IU]/mL (ref 0.450–4.500)
Total Protein: 6.2 g/dL (ref 6.0–8.5)
Triglycerides: 49 mg/dL (ref 0–149)

## 2018-12-08 ENCOUNTER — Other Ambulatory Visit: Payer: Self-pay

## 2018-12-08 ENCOUNTER — Encounter: Payer: Self-pay | Admitting: Physician Assistant

## 2018-12-08 ENCOUNTER — Ambulatory Visit: Payer: 59 | Admitting: Physician Assistant

## 2018-12-08 VITALS — BP 119/74 | HR 74 | Temp 97.4°F | Resp 12 | Ht 73.0 in | Wt 147.0 lb

## 2018-12-08 DIAGNOSIS — J302 Other seasonal allergic rhinitis: Secondary | ICD-10-CM

## 2018-12-08 DIAGNOSIS — R3911 Hesitancy of micturition: Secondary | ICD-10-CM

## 2018-12-08 DIAGNOSIS — Z0000 Encounter for general adult medical examination without abnormal findings: Secondary | ICD-10-CM

## 2018-12-08 DIAGNOSIS — R7301 Impaired fasting glucose: Secondary | ICD-10-CM

## 2018-12-08 LAB — POCT GLYCOSYLATED HEMOGLOBIN (HGB A1C): Hemoglobin A1C: 5.5 % (ref 4.0–5.6)
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
## 2018-12-09 ENCOUNTER — Encounter: Payer: Self-pay | Admitting: Physician Assistant

## 2019-03-29 ENCOUNTER — Telehealth: Payer: Self-pay

## 2019-03-29 DIAGNOSIS — Z1211 Encounter for screening for malignant neoplasm of colon: Secondary | ICD-10-CM

## 2019-03-29 NOTE — Telephone Encounter (Signed)
Norton Pastel (Spouse) sent email stating the following:  "Tyler Conner went to see the dr at [facility] about the colonoscopy & they said they weren't doing them at this time & gave me the number for someone at [facility].  I called them & they said they needed a referral.  Can you get that set up for him?  The number is [phone] & the fax number [phone]."  Sent electronic referral request to Dr. Orland Mustard.  His phone number was listed as [phone] in Epic search for [facility].  AMD

## 2019-10-27 DIAGNOSIS — Z03818 Encounter for observation for suspected exposure to other biological agents ruled out: Secondary | ICD-10-CM | POA: Diagnosis not present

## 2019-10-27 DIAGNOSIS — Z20822 Contact with and (suspected) exposure to covid-19: Secondary | ICD-10-CM | POA: Diagnosis not present

## 2019-10-31 ENCOUNTER — Other Ambulatory Visit: Payer: 59

## 2019-10-31 DIAGNOSIS — Z1152 Encounter for screening for COVID-19: Secondary | ICD-10-CM

## 2019-10-31 NOTE — Progress Notes (Signed)
Pt presented today with covid symptoms that started this morning. Head congestion, body aches and fatigue.

## 2019-11-02 DIAGNOSIS — Z1152 Encounter for screening for COVID-19: Secondary | ICD-10-CM | POA: Diagnosis not present

## 2019-11-02 DIAGNOSIS — Z03818 Encounter for observation for suspected exposure to other biological agents ruled out: Secondary | ICD-10-CM | POA: Diagnosis not present

## 2019-11-03 LAB — NOVEL CORONAVIRUS, NAA: SARS-CoV-2, NAA: NOT DETECTED

## 2019-11-03 LAB — SARS-COV-2, NAA 2 DAY TAT

## 2019-12-20 DIAGNOSIS — M545 Low back pain, unspecified: Secondary | ICD-10-CM | POA: Diagnosis not present

## 2020-05-30 NOTE — Progress Notes (Signed)
Provider visit needs to be rescheduled - no provider scheduled for 06/10/20.  AMD

## 2020-06-03 ENCOUNTER — Other Ambulatory Visit: Payer: Self-pay

## 2020-06-03 ENCOUNTER — Ambulatory Visit: Payer: Self-pay

## 2020-06-03 DIAGNOSIS — Z0000 Encounter for general adult medical examination without abnormal findings: Secondary | ICD-10-CM

## 2020-06-03 DIAGNOSIS — R8290 Unspecified abnormal findings in urine: Secondary | ICD-10-CM

## 2020-06-03 LAB — POCT URINALYSIS DIPSTICK
Bilirubin, UA: NEGATIVE
Blood, UA: NEGATIVE
Glucose, UA: POSITIVE — AB
Ketones, UA: NEGATIVE
Leukocytes, UA: NEGATIVE
Nitrite, UA: NEGATIVE
Protein, UA: NEGATIVE
Spec Grav, UA: >=1.03 — AB (ref 1.010–1.025)
Urobilinogen, UA: 0.2 E.U./dL
pH, UA: 5.5 (ref 5.0–8.0)

## 2020-06-03 NOTE — Addendum Note (Signed)
Addended by: Christianne Dolin F on: 06/03/2020 09:54 AM   Modules accepted: Orders

## 2020-06-03 NOTE — Progress Notes (Signed)
Pt scheduled to complete physical 06/24/20. CL,RMA

## 2020-06-04 LAB — CMP12+LP+TP+TSH+6AC+PSA+CBC…
ALT: 16 IU/L (ref 0–44)
AST: 19 IU/L (ref 0–40)
Albumin/Globulin Ratio: 2.4 — ABNORMAL HIGH (ref 1.2–2.2)
Albumin: 4.5 g/dL (ref 3.8–4.9)
Alkaline Phosphatase: 52 IU/L (ref 44–121)
BUN/Creatinine Ratio: 15 (ref 9–20)
BUN: 17 mg/dL (ref 6–24)
Basophils Absolute: 0 10*3/uL (ref 0.0–0.2)
Basos: 1 %
Bilirubin Total: 0.6 mg/dL (ref 0.0–1.2)
Calcium: 9.1 mg/dL (ref 8.7–10.2)
Chloride: 99 mmol/L (ref 96–106)
Chol/HDL Ratio: 4.2 ratio (ref 0.0–5.0)
Cholesterol, Total: 179 mg/dL (ref 100–199)
Creatinine, Ser: 1.13 mg/dL (ref 0.76–1.27)
EOS (ABSOLUTE): 0.1 10*3/uL (ref 0.0–0.4)
Eos: 3 %
Estimated CHD Risk: 0.8 times avg. (ref 0.0–1.0)
Free Thyroxine Index: 1.5 (ref 1.2–4.9)
GGT: 14 IU/L (ref 0–65)
Globulin, Total: 1.9 g/dL (ref 1.5–4.5)
Glucose: 152 mg/dL — ABNORMAL HIGH (ref 65–99)
HDL: 43 mg/dL (ref >39)
Hematocrit: 39.9 % (ref 37.5–51.0)
Hemoglobin: 14 g/dL (ref 13.0–17.7)
Immature Grans (Abs): 0 10*3/uL (ref 0.0–0.1)
Immature Granulocytes: 0 %
Iron: 145 ug/dL (ref 38–169)
LDH: 91 IU/L — ABNORMAL LOW (ref 121–224)
LDL Chol Calc (NIH): 122 mg/dL — ABNORMAL HIGH (ref 0–99)
Lymphocytes Absolute: 1.4 10*3/uL (ref 0.7–3.1)
Lymphs: 35 %
MCH: 32.8 pg (ref 26.6–33.0)
MCHC: 35.1 g/dL (ref 31.5–35.7)
MCV: 93 fL (ref 79–97)
Monocytes Absolute: 0.6 10*3/uL (ref 0.1–0.9)
Monocytes: 15 %
Neutrophils Absolute: 1.9 10*3/uL (ref 1.4–7.0)
Neutrophils: 46 %
Phosphorus: 3.3 mg/dL (ref 2.8–4.1)
Platelets: 190 10*3/uL (ref 150–450)
Potassium: 4.7 mmol/L (ref 3.5–5.2)
Prostate Specific Ag, Serum: 0.7 ng/mL (ref 0.0–4.0)
RBC: 4.27 x10E6/uL (ref 4.14–5.80)
RDW: 11.9 % (ref 11.6–15.4)
Sodium: 136 mmol/L (ref 134–144)
T3 Uptake Ratio: 26 % (ref 24–39)
T4, Total: 5.9 ug/dL (ref 4.5–12.0)
TSH: 0.755 u[IU]/mL (ref 0.450–4.500)
Total Protein: 6.4 g/dL (ref 6.0–8.5)
Triglycerides: 74 mg/dL (ref 0–149)
Uric Acid: 4.2 mg/dL (ref 3.8–8.4)
VLDL Cholesterol Cal: 14 mg/dL (ref 5–40)
WBC: 4 10*3/uL (ref 3.4–10.8)
eGFR: 78 mL/min/{1.73_m2} (ref >59)

## 2020-06-05 LAB — HGB A1C W/O EAG: Hgb A1c MFr Bld: 6.4 % — ABNORMAL HIGH (ref 4.8–5.6)

## 2020-06-05 LAB — SPECIMEN STATUS REPORT

## 2020-06-24 ENCOUNTER — Encounter: Payer: 59 | Admitting: Physician Assistant

## 2020-06-24 DIAGNOSIS — Z0000 Encounter for general adult medical examination without abnormal findings: Secondary | ICD-10-CM

## 2020-06-28 ENCOUNTER — Other Ambulatory Visit: Payer: Self-pay

## 2020-06-28 ENCOUNTER — Ambulatory Visit: Payer: Self-pay | Admitting: Physician Assistant

## 2020-06-28 ENCOUNTER — Encounter: Payer: Self-pay | Admitting: Physician Assistant

## 2020-06-28 VITALS — BP 121/85 | HR 96 | Temp 98.1°F | Resp 14 | Ht 73.0 in | Wt 157.0 lb

## 2020-06-28 DIAGNOSIS — Z0000 Encounter for general adult medical examination without abnormal findings: Secondary | ICD-10-CM

## 2020-06-28 MED ORDER — METFORMIN HCL 500 MG PO TABS: 500.0000 mg | ORAL_TABLET | Freq: Two times a day (BID) | ORAL | 3 refills | Status: DC

## 2020-06-28 NOTE — Progress Notes (Signed)
   Subjective: Annual physical exam    Patient ID: Tyler Mangual., male    DOB: 12-14-1965, 55 y.o.   MRN: [id number]  HPI Patient presents annual physical exam voices no concerns or complaints.   Review of Systems    Negative Objective:   Physical Exam  No acute distress.  Temperature 98.1, pulse 96, respiration 14, BP is 121/85, and patient O2 sat is 97%. HEENT is unremarkable.  Neck is supple for adenopathy or bruits.  Lungs are clear to auscultation.  Heart regular rate and rhythm.  No acute findings on EKG. Abdomen with negative HSM, normoactive bowel sounds, soft, nontender to palpation. No obvious deformity to the upper or lower extremities.  Patient has full and equal range of motion of the upper and lower extremities. No obvious deformity of the cervical or lumbar spine.  Patient has full and equal range of motion cervical lumbar spine. Cranial nerves II through XII are grossly intact.      Assessment & Plan: Well exam.  Discussed patient lab results patient has positive glucose in his urine, patient glucose is 152, patient will globin A1c is 6.4.  Discussed patient rationale for starting metformin for diabetic control.  Patient will follow-up in 3 months with fasting labs and repeat hemoglobin A1c.

## 2020-07-09 DIAGNOSIS — R7303 Prediabetes: Secondary | ICD-10-CM | POA: Insufficient documentation

## 2020-07-09 DIAGNOSIS — E785 Hyperlipidemia, unspecified: Secondary | ICD-10-CM | POA: Diagnosis not present

## 2020-08-01 DIAGNOSIS — Z03818 Encounter for observation for suspected exposure to other biological agents ruled out: Secondary | ICD-10-CM | POA: Diagnosis not present

## 2020-08-01 DIAGNOSIS — Z20822 Contact with and (suspected) exposure to covid-19: Secondary | ICD-10-CM | POA: Diagnosis not present

## 2020-08-02 ENCOUNTER — Encounter: Payer: Self-pay | Admitting: Adult Health

## 2020-08-02 ENCOUNTER — Ambulatory Visit: Payer: Self-pay | Admitting: Adult Health

## 2020-08-02 ENCOUNTER — Telehealth: Payer: Self-pay

## 2020-08-02 DIAGNOSIS — Z20822 Contact with and (suspected) exposure to covid-19: Secondary | ICD-10-CM | POA: Diagnosis not present

## 2020-08-02 DIAGNOSIS — Z03818 Encounter for observation for suspected exposure to other biological agents ruled out: Secondary | ICD-10-CM | POA: Diagnosis not present

## 2020-08-02 MED ORDER — NIRMATRELVIR/RITONAVIR (PAXLOVID)TABLET: 3.0000 | ORAL_TABLET | Freq: Two times a day (BID) | ORAL | 0 refills | Status: AC

## 2020-08-02 NOTE — Progress Notes (Signed)
[facility] [address] [place], [place] [address]   Office Visit Note  Patient Name: Tyler Conner  [id number]  [id number]  Date of Service: 08/02/2020  Chief Complaint  Patient presents with   Covid Positive     HPI Pt is on phone for virtual visit. He reports He began having symptoms of covid for 4 days.  He has taken multiple home tests, and multiple tests at [facility].  Today, his test is finally positive. He has questions about paxlovid.  His symptoms were initially chills, fatigue, mild cough and aching muscles.  He denies fever or wheezing. He reports his symptoms are mostly gone at this time. He is not vaccinated.  Has been taking Dayquil and Nyquil.  He is pre-diabetic and does not take his metformin.  He has been managing his diet. Last A1C was 6.4.       Current Medication:  Outpatient Encounter Medications as of 08/02/2020  Medication Sig   ibuprofen (ADVIL) 600 MG tablet Take 600 mg by mouth every 6 (six) hours as needed.   nirmatrelvir/ritonavir EUA (PAXLOVID) TABS Take 3 tablets by mouth 2 (two) times daily for 5 days. (Take nirmatrelvir 150 mg two tablets twice daily for 5 days and ritonavir 100 mg one tablet twice daily for 5 days) Patient GFR is 78   [DISCONTINUED] metFORMIN (GLUCOPHAGE) 500 MG tablet Take 1 tablet (500 mg total) by mouth 2 (two) times daily with a meal.   No facility-administered encounter medications on file as of 08/02/2020.      Medical History: Past Medical History:  Diagnosis Date   Allergic rhinitis    ED (erectile dysfunction)    Impaired fasting glucose    Inguinal hernia    Poor dentition      Vital Signs: There were no vitals taken for this visit.   Review of Systems  Constitutional:  Positive for fatigue. Negative for activity change, chills, fever and unexpected weight change.  HENT:  Positive for sinus pressure. Negative for congestion, ear discharge, ear pain and postnasal drip.    Neurological:  Negative for dizziness.   Physical exam deferred, patient seen over telephone.     Assessment/Plan: 1. COVID Discussed paxlovid with patient.  Today is 4th day of symptoms.  If his symptoms get worse tonight, or tomorrow he may start medication.  If not, he is told to not pick up medication as he will be out of the window for treatment.  He verbalized understanding.  - nirmatrelvir/ritonavir EUA (PAXLOVID) TABS; Take 3 tablets by mouth 2 (two) times daily for 5 days. (Take nirmatrelvir 150 mg two tablets twice daily for 5 days and ritonavir 100 mg one tablet twice daily for 5 days) Patient GFR is 78  Dispense: 30 tablet; Refill: 0     General Counseling: Kyreese verbalizes understanding of the findings of todays visit and agrees with plan of treatment. I have discussed any further diagnostic evaluation that may be needed or ordered today. We also reviewed his medications today. he has been encouraged to call the office with any questions or concerns that should arise related to todays visit.   No orders of the defined types were placed in this encounter.   Meds ordered this encounter  Medications   nirmatrelvir/ritonavir EUA (PAXLOVID) TABS    Sig: Take 3 tablets by mouth 2 (two) times daily for 5 days. (Take nirmatrelvir 150 mg two tablets twice daily for 5 days and ritonavir 100 mg one  tablet twice daily for 5 days) Patient GFR is 78    Dispense:  30 tablet    Refill:  0    Time spent:20 Minutes    Johnna Acosta AGNP-C Nurse Practitioner

## 2020-08-02 NOTE — Telephone Encounter (Signed)
Wife Tyler Conner called to report Pt has tested positive for COVID as of 07/31/20. Sx began 6/14. Requesting Paxlovid for management of current symptoms. Virtual visit scheduled with provider this afternoon at 345 to discuss treatment options and recommendations.

## 2020-09-19 ENCOUNTER — Other Ambulatory Visit: Payer: 59

## 2020-09-26 ENCOUNTER — Ambulatory Visit: Payer: 59

## 2020-10-02 DIAGNOSIS — E785 Hyperlipidemia, unspecified: Secondary | ICD-10-CM | POA: Diagnosis not present

## 2020-10-02 DIAGNOSIS — R7303 Prediabetes: Secondary | ICD-10-CM | POA: Diagnosis not present

## 2020-10-09 DIAGNOSIS — Z125 Encounter for screening for malignant neoplasm of prostate: Secondary | ICD-10-CM | POA: Diagnosis not present

## 2020-10-09 DIAGNOSIS — E785 Hyperlipidemia, unspecified: Secondary | ICD-10-CM | POA: Diagnosis not present

## 2020-10-09 DIAGNOSIS — M545 Low back pain, unspecified: Secondary | ICD-10-CM | POA: Diagnosis not present

## 2020-10-09 DIAGNOSIS — R7303 Prediabetes: Secondary | ICD-10-CM | POA: Diagnosis not present

## 2021-09-02 ENCOUNTER — Other Ambulatory Visit: Payer: Self-pay

## 2021-09-02 DIAGNOSIS — Z0000 Encounter for general adult medical examination without abnormal findings: Secondary | ICD-10-CM

## 2021-09-03 LAB — CMP12+LP+TP+TSH+6AC+PSA+CBC…
ALT: 14 IU/L (ref 0–44)
AST: 20 IU/L (ref 0–40)
Albumin/Globulin Ratio: 2.5 — ABNORMAL HIGH (ref 1.2–2.2)
Albumin: 4.7 g/dL (ref 3.8–4.9)
Alkaline Phosphatase: 53 IU/L (ref 44–121)
BUN/Creatinine Ratio: 22 — ABNORMAL HIGH (ref 9–20)
BUN: 23 mg/dL (ref 6–24)
Basophils Absolute: 0 10*3/uL (ref 0.0–0.2)
Basos: 0 %
Bilirubin Total: 0.7 mg/dL (ref 0.0–1.2)
Calcium: 9.5 mg/dL (ref 8.7–10.2)
Chloride: 102 mmol/L (ref 96–106)
Chol/HDL Ratio: 2.7 ratio (ref 0.0–5.0)
Cholesterol, Total: 173 mg/dL (ref 100–199)
Creatinine, Ser: 1.03 mg/dL (ref 0.76–1.27)
EOS (ABSOLUTE): 0.1 10*3/uL (ref 0.0–0.4)
Eos: 2 %
Estimated CHD Risk: <0.5 times avg. (ref 0.0–1.0)
Free Thyroxine Index: 1.5 (ref 1.2–4.9)
GGT: 12 IU/L (ref 0–65)
Globulin, Total: 1.9 g/dL (ref 1.5–4.5)
Glucose: 118 mg/dL — ABNORMAL HIGH (ref 70–99)
HDL: 63 mg/dL (ref >39)
Hematocrit: 40.3 % (ref 37.5–51.0)
Hemoglobin: 13.9 g/dL (ref 13.0–17.7)
Immature Grans (Abs): 0 10*3/uL (ref 0.0–0.1)
Immature Granulocytes: 0 %
Iron: 162 ug/dL (ref 38–169)
LDH: 89 IU/L — ABNORMAL LOW (ref 121–224)
LDL Chol Calc (NIH): 94 mg/dL (ref 0–99)
Lymphocytes Absolute: 1.4 10*3/uL (ref 0.7–3.1)
Lymphs: 31 %
MCH: 32.9 pg (ref 26.6–33.0)
MCHC: 34.5 g/dL (ref 31.5–35.7)
MCV: 95 fL (ref 79–97)
Monocytes Absolute: 0.5 10*3/uL (ref 0.1–0.9)
Monocytes: 11 %
Neutrophils Absolute: 2.6 10*3/uL (ref 1.4–7.0)
Neutrophils: 56 %
Phosphorus: 3.5 mg/dL (ref 2.8–4.1)
Platelets: 195 10*3/uL (ref 150–450)
Potassium: 4.7 mmol/L (ref 3.5–5.2)
Prostate Specific Ag, Serum: 0.6 ng/mL (ref 0.0–4.0)
RBC: 4.23 x10E6/uL (ref 4.14–5.80)
RDW: 11.8 % (ref 11.6–15.4)
Sodium: 140 mmol/L (ref 134–144)
T3 Uptake Ratio: 26 % (ref 24–39)
T4, Total: 5.9 ug/dL (ref 4.5–12.0)
TSH: 0.809 u[IU]/mL (ref 0.450–4.500)
Total Protein: 6.6 g/dL (ref 6.0–8.5)
Triglycerides: 87 mg/dL (ref 0–149)
Uric Acid: 4.9 mg/dL (ref 3.8–8.4)
VLDL Cholesterol Cal: 16 mg/dL (ref 5–40)
WBC: 4.6 10*3/uL (ref 3.4–10.8)
eGFR: 86 mL/min/{1.73_m2} (ref >59)

## 2021-09-05 LAB — HGB A1C W/O EAG: Hgb A1c MFr Bld: 6.1 % — ABNORMAL HIGH (ref 4.8–5.6)

## 2021-09-05 LAB — SPECIMEN STATUS REPORT

## 2021-10-13 DIAGNOSIS — Z1331 Encounter for screening for depression: Secondary | ICD-10-CM | POA: Diagnosis not present

## 2021-10-13 DIAGNOSIS — R7303 Prediabetes: Secondary | ICD-10-CM | POA: Diagnosis not present

## 2021-10-13 DIAGNOSIS — Z0000 Encounter for general adult medical examination without abnormal findings: Secondary | ICD-10-CM | POA: Diagnosis not present

## 2021-10-13 DIAGNOSIS — E785 Hyperlipidemia, unspecified: Secondary | ICD-10-CM | POA: Diagnosis not present

## 2021-11-13 DIAGNOSIS — H5213 Myopia, bilateral: Secondary | ICD-10-CM | POA: Diagnosis not present

## 2021-12-09 DIAGNOSIS — M25511 Pain in right shoulder: Secondary | ICD-10-CM | POA: Diagnosis not present

## 2021-12-09 DIAGNOSIS — M7711 Lateral epicondylitis, right elbow: Secondary | ICD-10-CM | POA: Diagnosis not present

## 2023-03-02 ENCOUNTER — Ambulatory Visit: Payer: Self-pay

## 2023-03-02 DIAGNOSIS — Z0000 Encounter for general adult medical examination without abnormal findings: Secondary | ICD-10-CM

## 2023-03-02 LAB — POCT URINALYSIS DIPSTICK
Bilirubin, UA: NEGATIVE
Blood, UA: NEGATIVE
Glucose, UA: NEGATIVE
Ketones, UA: NEGATIVE
Leukocytes, UA: NEGATIVE
Nitrite, UA: NEGATIVE
Protein, UA: NEGATIVE
Spec Grav, UA: 1.015 (ref 1.010–1.025)
Urobilinogen, UA: 0.2 U/dL
pH, UA: 6.5 (ref 5.0–8.0)

## 2023-03-02 NOTE — Progress Notes (Signed)
 Pt completed labs for physical. Tyler Conner

## 2023-03-07 LAB — CMP12+LP+TP+TSH+6AC+PSA+CBC…
ALT: 20 [IU]/L (ref 0–44)
AST: 23 [IU]/L (ref 0–40)
Albumin: 4.5 g/dL (ref 3.8–4.9)
Alkaline Phosphatase: 47 [IU]/L (ref 44–121)
BUN/Creatinine Ratio: 19 (ref 9–20)
BUN: 22 mg/dL (ref 6–24)
Basophils Absolute: 0 10*3/uL (ref 0.0–0.2)
Basos: 0 %
Bilirubin Total: 0.3 mg/dL (ref 0.0–1.2)
Calcium: 9.4 mg/dL (ref 8.7–10.2)
Chloride: 102 mmol/L (ref 96–106)
Chol/HDL Ratio: 3.2 {ratio} (ref 0.0–5.0)
Cholesterol, Total: 145 mg/dL (ref 100–199)
Creatinine, Ser: 1.13 mg/dL (ref 0.76–1.27)
EOS (ABSOLUTE): 0.1 10*3/uL (ref 0.0–0.4)
Eos: 2 %
Estimated CHD Risk: <0.5 times avg. (ref 0.0–1.0)
Free Thyroxine Index: 1.9 (ref 1.2–4.9)
GGT: 9 [IU]/L (ref 0–65)
Globulin, Total: 2.1 g/dL (ref 1.5–4.5)
Glucose: 107 mg/dL — ABNORMAL HIGH (ref 70–99)
HDL: 45 mg/dL (ref >39)
Hematocrit: 36.9 % — ABNORMAL LOW (ref 37.5–51.0)
Hemoglobin: 13 g/dL (ref 13.0–17.7)
Immature Grans (Abs): 0 10*3/uL (ref 0.0–0.1)
Immature Granulocytes: 0 %
Iron: 109 ug/dL (ref 38–169)
LDH: 93 [IU]/L — ABNORMAL LOW (ref 121–224)
LDL Chol Calc (NIH): 90 mg/dL (ref 0–99)
Lymphocytes Absolute: 1.2 10*3/uL (ref 0.7–3.1)
Lymphs: 31 %
MCH: 33.2 pg — ABNORMAL HIGH (ref 26.6–33.0)
MCHC: 35.2 g/dL (ref 31.5–35.7)
MCV: 94 fL (ref 79–97)
Monocytes Absolute: 0.5 10*3/uL (ref 0.1–0.9)
Monocytes: 13 %
Neutrophils Absolute: 2.2 10*3/uL (ref 1.4–7.0)
Neutrophils: 54 %
Phosphorus: 3.9 mg/dL (ref 2.8–4.1)
Platelets: 201 10*3/uL (ref 150–450)
Potassium: 4.3 mmol/L (ref 3.5–5.2)
Prostate Specific Ag, Serum: 0.5 ng/mL (ref 0.0–4.0)
RBC: 3.91 x10E6/uL — ABNORMAL LOW (ref 4.14–5.80)
RDW: 11 % — ABNORMAL LOW (ref 11.6–15.4)
Sodium: 142 mmol/L (ref 134–144)
T3 Uptake Ratio: 27 % (ref 24–39)
T4, Total: 7.2 ug/dL (ref 4.5–12.0)
TSH: 1.06 u[IU]/mL (ref 0.450–4.500)
Total Protein: 6.6 g/dL (ref 6.0–8.5)
Triglycerides: 46 mg/dL (ref 0–149)
Uric Acid: 4.2 mg/dL (ref 3.8–8.4)
VLDL Cholesterol Cal: 10 mg/dL (ref 5–40)
WBC: 4 10*3/uL (ref 3.4–10.8)
eGFR: 77 mL/min/{1.73_m2} (ref >59)

## 2023-03-07 LAB — HGB A1C W/O EAG: Hgb A1c MFr Bld: 6.2 % — ABNORMAL HIGH (ref 4.8–5.6)

## 2023-04-01 ENCOUNTER — Encounter: Payer: Self-pay | Admitting: Physician Assistant

## 2023-04-01 ENCOUNTER — Ambulatory Visit: Payer: Self-pay | Admitting: Physician Assistant

## 2023-04-01 DIAGNOSIS — R6889 Other general symptoms and signs: Secondary | ICD-10-CM

## 2023-04-01 LAB — POC COVID19 BINAXNOW: SARS Coronavirus 2 Ag: NEGATIVE

## 2023-04-01 LAB — POCT INFLUENZA A/B
Influenza A, POC: POSITIVE — AB
Influenza B, POC: NEGATIVE

## 2023-04-01 MED ORDER — PSEUDOEPH-BROMPHEN-DM 30-2-10 MG/5ML PO SYRP: 5.0000 mL | ORAL_SOLUTION | Freq: Four times a day (QID) | ORAL | 0 refills | Status: AC | PRN

## 2023-04-01 MED ORDER — OSELTAMIVIR PHOSPHATE 75 MG PO CAPS
75.0000 mg | ORAL_CAPSULE | Freq: Two times a day (BID) | ORAL | 0 refills | Status: AC
Start: 2023-04-01 — End: ?

## 2023-04-01 MED ORDER — IBUPROFEN 600 MG PO TABS: 600.0000 mg | ORAL_TABLET | Freq: Four times a day (QID) | ORAL | 0 refills | Status: AC | PRN

## 2023-04-01 NOTE — Progress Notes (Signed)
   Subjective: Fever and chills    Patient ID: Tyler Eng., male    DOB: 08/05/1965, 58 y.o.   MRN: [id number]  HPI Patient complaining of bodyaches, chills, and fever x 1 day.  Denies recent travel or known contact with COVID-19.  Patient test positive for influenza A today.   Review of Systems Negative except for above complaint    Objective:   Physical Exam  Physical exam was deferred secondary to this being a telephonic encounter      Assessment & Plan: Influenza A   Patient given a prescription for Tamiflu, Bromfed-DM, and ibuprofen.  Advised to follow-up if no improvement or worsening complaint.

## 2023-04-01 NOTE — Progress Notes (Signed)
Fever, chills, body aches x 1 day.

## 2023-07-14 DIAGNOSIS — L538 Other specified erythematous conditions: Secondary | ICD-10-CM | POA: Diagnosis not present

## 2023-07-14 DIAGNOSIS — L728 Other follicular cysts of the skin and subcutaneous tissue: Secondary | ICD-10-CM | POA: Diagnosis not present

## 2023-07-14 DIAGNOSIS — L309 Dermatitis, unspecified: Secondary | ICD-10-CM | POA: Diagnosis not present

## 2023-07-14 DIAGNOSIS — L905 Scar conditions and fibrosis of skin: Secondary | ICD-10-CM | POA: Diagnosis not present

## 2023-11-10 DIAGNOSIS — H5203 Hypermetropia, bilateral: Secondary | ICD-10-CM | POA: Diagnosis not present

## 2023-11-10 DIAGNOSIS — H52223 Regular astigmatism, bilateral: Secondary | ICD-10-CM | POA: Diagnosis not present

## 2023-11-10 DIAGNOSIS — R7303 Prediabetes: Secondary | ICD-10-CM | POA: Diagnosis not present

## 2023-11-10 DIAGNOSIS — H524 Presbyopia: Secondary | ICD-10-CM | POA: Diagnosis not present
